# Patient Record
Sex: Female | Born: 1975 | Race: White | Hispanic: No | Marital: Married | Smoking: Never smoker
Health system: Southern US, Community
[De-identification: ages and names within clinical notes are randomized; demographics above are authoritative.]

## PROBLEM LIST (undated history)

## (undated) DIAGNOSIS — E785 Hyperlipidemia, unspecified: Secondary | ICD-10-CM

## (undated) HISTORY — PX: TUBAL LIGATION: SHX77

## (undated) HISTORY — DX: Hyperlipidemia, unspecified: E78.5

---

## 2000-08-09 ENCOUNTER — Other Ambulatory Visit: Admission: RE | Admit: 2000-08-09 | Discharge: 2000-08-09 | Payer: Self-pay | Admitting: Obstetrics and Gynecology

## 2001-12-26 ENCOUNTER — Other Ambulatory Visit: Admission: RE | Admit: 2001-12-26 | Discharge: 2001-12-26 | Payer: Self-pay | Admitting: Obstetrics and Gynecology

## 2003-04-07 ENCOUNTER — Inpatient Hospital Stay (HOSPITAL_COMMUNITY): Admission: AD | Admit: 2003-04-07 | Discharge: 2003-04-10 | Payer: Self-pay | Admitting: Obstetrics and Gynecology

## 2003-05-20 ENCOUNTER — Other Ambulatory Visit: Admission: RE | Admit: 2003-05-20 | Discharge: 2003-05-20 | Payer: Self-pay | Admitting: Obstetrics and Gynecology

## 2007-01-06 ENCOUNTER — Inpatient Hospital Stay (HOSPITAL_COMMUNITY): Admission: AD | Admit: 2007-01-06 | Discharge: 2007-01-07 | Payer: Self-pay | Admitting: Obstetrics and Gynecology

## 2007-01-08 ENCOUNTER — Inpatient Hospital Stay (HOSPITAL_COMMUNITY): Admission: AD | Admit: 2007-01-08 | Discharge: 2007-01-10 | Payer: Self-pay | Admitting: Obstetrics and Gynecology

## 2007-01-09 ENCOUNTER — Encounter (INDEPENDENT_AMBULATORY_CARE_PROVIDER_SITE_OTHER): Payer: Self-pay | Admitting: Obstetrics & Gynecology

## 2010-01-31 ENCOUNTER — Emergency Department (HOSPITAL_COMMUNITY): Admission: EM | Admit: 2010-01-31 | Discharge: 2010-01-31 | Payer: Self-pay | Admitting: Emergency Medicine

## 2010-08-31 NOTE — Op Note (Signed)
NAMECEDAR, Brandi Lloyd               ACCOUNT NO.:  [id number]   MEDICAL RECORD NO.:  [id number]          PATIENT TYPE:  INP   LOCATION:  9110                          FACILITY:  [facility]   PHYSICIAN:  Gerrit Friends. Aldona Bar, M.D.   DATE OF BIRTH:  1975/08/05   DATE OF PROCEDURE:  01/09/2007  DATE OF DISCHARGE:                               OPERATIVE REPORT   PREOPERATIVE DIAGNOSES:  1. Postpartum.  2. Desire for permanent elective sterilization.   POSTOPERATIVE DIAGNOSES:  1. Postpartum.  2. Desire for permanent elective sterilization.  3. Pathology pending.   PROCEDURE:  [place] tubal sterilization procedure for permanent elective  sterilization.   SURGEON:  Gerrit Friends. Aldona Bar, M.D.   ANESTHESIA:  Failed epidural/general endotracheal.   HISTORY:  This 35 year old patient delivered on the evening of September  22 and expressed a desire for a permanent elective sterilization  procedure.  She is aware that such a procedure is meant to be 100%  permanent but, unfortunately, is not 100% perfect as subsequent  pregnancy can result.   According to her wishes, she is now being taken to the operating room  for a postpartum tubal sterilization procedure.   Once in the operating room, the epidural was augmented but,  unfortunately, was not found to be effective.  Thereafter general  endotracheal anesthesia was carried out without difficulty.   Once the patient was appropriately anesthetized, she was prepped and  draped in the usual fashion and procedure was begun.   A 1.5-cm subumbilical midline skin incision was made and with minimal  difficulty dissected down to and through the peritoneum.  The fundus of  the uterus felt and looked very normal.  The right ovary was not  visualized or palpated, but the right fallopian tube came into easy  view, was traced out the fimbriated end for positive identification, and  then the midportion of the right fallopian tube in an area was elevated  and a single  free tie of #1 plain catgut suture tied about the knuckle  and the knuckle was excised and sent to pathology with good hemostasis  resulting.   A similar procedure was carried out on the left fallopian tube without  difficulty.   Once both fallopian tubes had been appropriately rendered such that a  segment had been removed, with hemostasis noted be adequate, the  procedure was felt to be complete and closure of the abdomen was carried  out.  The abdominal peritoneum was closed with 0 Vicryl in a running  fashion.  Fascia was closed with 0 Vicryl in interrupted fashion and  skin was closed in a subcuticular fashion with 4-0 Vicryl.  A pressure  dressing was applied and the patient was awakened and transported to the  recovery room in satisfactory condition, having tolerated it well.  Estimated blood loss negligible.  All counts correct x2.  Pathologic  specimen consisted of a segment of each fallopian tube.      Gerrit Friends. Aldona Bar, M.D.  Electronically Signed     RMW/MEDQ  D:  01/09/2007  T:  01/09/2007  Job:  [id number]

## 2010-09-03 NOTE — Discharge Summary (Signed)
NAMEALPA, SALVO                         ACCOUNT NO.:  [id number]   MEDICAL RECORD NO.:  [id number]                   PATIENT TYPE:  INP   LOCATION:  9115                                 FACILITY:  [facility]   PHYSICIAN:  Gerrit Friends. Aldona Bar, M.D.                DATE OF BIRTH:  [birth year]   DATE OF ADMISSION:  04/07/2003  DATE OF DISCHARGE:  04/10/2003                                 DISCHARGE SUMMARY   DISCHARGE DIAGNOSES:  1. Term pregnancy, delivered 6 pound 8 ounce female infant, Apgars 8 and 9.  2. Blood type O+.   PROCEDURE:  1. Normal spontaneous delivery.  2. First degree tear and repair.   SUMMARY:  This 35 year old primigravida was admitted at term in early labor  after a totally uncomplicated pregnancy.  At the time of admission she was 2  cm dilated with the vertex at -1/-2 station.  Amniotomy was carried out with  production of clear fluid and the patient was observed.  She progressed well  and had a normal spontaneous delivery of a viable female infant over a first  degree tear which was repaired.  Her postpartum course was totally benign.  Her discharge hemoglobin was 12.9 with a white count of 16,300 and a  platelet count of 146,000.  On the morning of December 23 she was ambulating  well, tolerating a regular diet well, having normal bowel and bladder  function, was afebrile.  Her breast-feeding was going well and she was  deemed ready for discharge.  Accordingly, she was given all appropriate  instructions and understood all instructions well.   DISCHARGE MEDICATIONS:  1. Vitamins one daily.  2. Motrin 600 mg q.6h. as needed for pain.  3. Tylox one to two q.4-6h. as needed for more severe pain.   FOLLOWUP:  She will return to the office for follow-up in approximately four  weeks' time.   CONDITION ON DISCHARGE:  Improved.                                               Gerrit Friends. Aldona Bar, M.D.    RMW/MEDQ  D:  04/10/2003  T:  04/10/2003  Job:  [id number]

## 2010-09-03 NOTE — Discharge Summary (Signed)
Brandi Lloyd, Brandi Lloyd               ACCOUNT NO.:  [id number]   MEDICAL RECORD NO.:  [id number]          PATIENT TYPE:  INP   LOCATION:  9110                          [facility]ACILITY:  [facility]   PHYSICIAN:  Malva Limes, M.D.    DATE OF BIRTH:  20-Dec-1975   DATE OF ADMISSION:  01/08/2007  DATE OF DISCHARGE:  01/10/2007                               DISCHARGE SUMMARY   FINAL DIAGNOSES:  1. Intrauterine pregnancy at 40 and one-seventh weeks gestation.  2. Active labor.  3. Spontaneous vaginal delivery of a female infant with Apgars of 7      and 9 over a second-degree perineal laceration.  Delivery performed      by Dr. Waynard Reeds.  4. Postpartum tubal ligation procedure performed by Dr. Annamaria Helling.   COMPLICATIONS:  None.   This 35 year old G2, P1-0-0-1 presents at [redacted] weeks gestation in active  labor.  The patient's antepartum course up to this point had been  uncomplicated.  She has expressed her desire for permanent sterilization  postpartum.  The patient had the delivery of a 6-pound 12-ounce female  infant with Apgars of 7 and 9 over a second-degree laceration which was  repaired.  The delivery went without complication.  This was performed  by Dr. Waynard Reeds.  She still expressed her desires for postpartum  tubal ligation.  She was kept n.p.o.  She had a discussion with Dr.  Annamaria Helling the next morning and was taken for her postpartum tubal  ligation which was, like I said, performed by Dr. Annamaria Helling.  The  procedure went without complications and the patient's postoperative  course was benign without any significant fevers.  She was felt ready  for discharge on postpartum day #2, which was postoperative day #1.  She  was sent home on a regular diet, told to decrease activities, told to  continue her prenatal vitamins, was given Percocet one to two every 4-6  hours as needed for pain, was to follow up in our office in 4 weeks.   LABORATORY ON DISCHARGE:  The patient had a  hemoglobin of a 12.5; white  blood cell count of 13.3; and platelets of 190,000.      Leilani Able, P.A.-C.    ______________________________  Malva Limes, M.D.    MB/MEDQ  D:  02/16/2007  T:  02/17/2007  Job:  [id number]

## 2011-01-19 ENCOUNTER — Other Ambulatory Visit: Payer: Self-pay | Admitting: Obstetrics and Gynecology

## 2011-01-27 LAB — CBC
HCT: 35.9 — ABNORMAL LOW
MCHC: 34.5
MCV: 96.6
Platelets: 190
RBC: 4.44
WBC: 11.9 — ABNORMAL HIGH
WBC: 13.3 — ABNORMAL HIGH

## 2011-01-27 LAB — COMPREHENSIVE METABOLIC PANEL
AST: 34
Albumin: 2.9 — ABNORMAL LOW
Chloride: 105
Creatinine, Ser: 0.65
GFR calc Af Amer: >60
Potassium: 3.1 — ABNORMAL LOW
Sodium: 134 — ABNORMAL LOW
Total Bilirubin: 0.9

## 2011-01-27 LAB — RPR: RPR Ser Ql: NONREACTIVE

## 2013-07-11 ENCOUNTER — Other Ambulatory Visit: Payer: Self-pay | Admitting: Obstetrics and Gynecology

## 2014-09-05 ENCOUNTER — Other Ambulatory Visit: Payer: Self-pay | Admitting: Obstetrics and Gynecology

## 2014-09-08 LAB — CYTOLOGY - PAP

## 2014-09-09 ENCOUNTER — Other Ambulatory Visit: Payer: Self-pay | Admitting: Obstetrics and Gynecology

## 2014-09-09 DIAGNOSIS — R928 Other abnormal and inconclusive findings on diagnostic imaging of breast: Secondary | ICD-10-CM

## 2014-10-24 ENCOUNTER — Ambulatory Visit
Admission: RE | Admit: 2014-10-24 | Discharge: 2014-10-24 | Disposition: A | Discharge status: Home / Self Care | Payer: 59 | Source: Ambulatory Visit | Attending: Obstetrics and Gynecology | Admitting: Obstetrics and Gynecology

## 2014-10-24 DIAGNOSIS — R928 Other abnormal and inconclusive findings on diagnostic imaging of breast: Secondary | ICD-10-CM

## 2015-11-04 ENCOUNTER — Other Ambulatory Visit: Payer: Self-pay | Admitting: Obstetrics and Gynecology

## 2015-11-05 LAB — CYTOLOGY - PAP

## 2015-11-16 ENCOUNTER — Other Ambulatory Visit: Payer: Self-pay | Admitting: Obstetrics and Gynecology

## 2015-11-16 DIAGNOSIS — R928 Other abnormal and inconclusive findings on diagnostic imaging of breast: Secondary | ICD-10-CM

## 2015-11-25 ENCOUNTER — Ambulatory Visit
Admission: RE | Admit: 2015-11-25 | Discharge: 2015-11-25 | Disposition: A | Discharge status: Home / Self Care | Payer: 59 | Source: Ambulatory Visit | Attending: Obstetrics and Gynecology | Admitting: Obstetrics and Gynecology

## 2015-11-25 ENCOUNTER — Other Ambulatory Visit: Payer: Self-pay | Admitting: Obstetrics and Gynecology

## 2015-11-25 DIAGNOSIS — N63 Unspecified lump in unspecified breast: Secondary | ICD-10-CM

## 2015-11-25 DIAGNOSIS — R928 Other abnormal and inconclusive findings on diagnostic imaging of breast: Secondary | ICD-10-CM

## 2015-11-26 ENCOUNTER — Other Ambulatory Visit: Payer: Self-pay | Admitting: Obstetrics and Gynecology

## 2015-11-26 DIAGNOSIS — N63 Unspecified lump in unspecified breast: Secondary | ICD-10-CM

## 2015-11-27 ENCOUNTER — Ambulatory Visit
Admission: RE | Admit: 2015-11-27 | Discharge: 2015-11-27 | Disposition: A | Discharge status: Home / Self Care | Payer: 59 | Source: Ambulatory Visit | Attending: Obstetrics and Gynecology | Admitting: Obstetrics and Gynecology

## 2015-11-27 DIAGNOSIS — N63 Unspecified lump in unspecified breast: Secondary | ICD-10-CM

## 2016-04-29 DIAGNOSIS — J029 Acute pharyngitis, unspecified: Secondary | ICD-10-CM | POA: Diagnosis not present

## 2016-04-29 DIAGNOSIS — R05 Cough: Secondary | ICD-10-CM | POA: Diagnosis not present

## 2016-05-09 ENCOUNTER — Other Ambulatory Visit: Payer: Self-pay | Admitting: Obstetrics and Gynecology

## 2016-05-09 DIAGNOSIS — N63 Unspecified lump in unspecified breast: Secondary | ICD-10-CM

## 2016-06-14 ENCOUNTER — Ambulatory Visit
Admission: RE | Admit: 2016-06-14 | Discharge: 2016-06-14 | Disposition: A | Discharge status: Home / Self Care | Payer: 59 | Source: Ambulatory Visit | Attending: Obstetrics and Gynecology | Admitting: Obstetrics and Gynecology

## 2016-06-14 DIAGNOSIS — N63 Unspecified lump in unspecified breast: Secondary | ICD-10-CM

## 2016-06-14 DIAGNOSIS — R922 Inconclusive mammogram: Secondary | ICD-10-CM | POA: Diagnosis not present

## 2017-03-01 IMAGING — MG 2D DIGITAL DIAGNOSTIC UNILATERAL RIGHT MAMMOGRAM WITH CAD AND AD
4 series · 4 of 12 positions shown · non-contrast
Comparison: Previous exams including recent screening mammogram
dated 11/04/2015.

CLINICAL DATA: Patient presents today for further evaluation of a
possible right breast mass identified on recent screening mammogram.

EXAM:
2D DIGITAL DIAGNOSTIC RIGHT MAMMOGRAM WITH CAD AND ADJUNCT TOMO
ULTRASOUND RIGHT BREAST

[R CC]
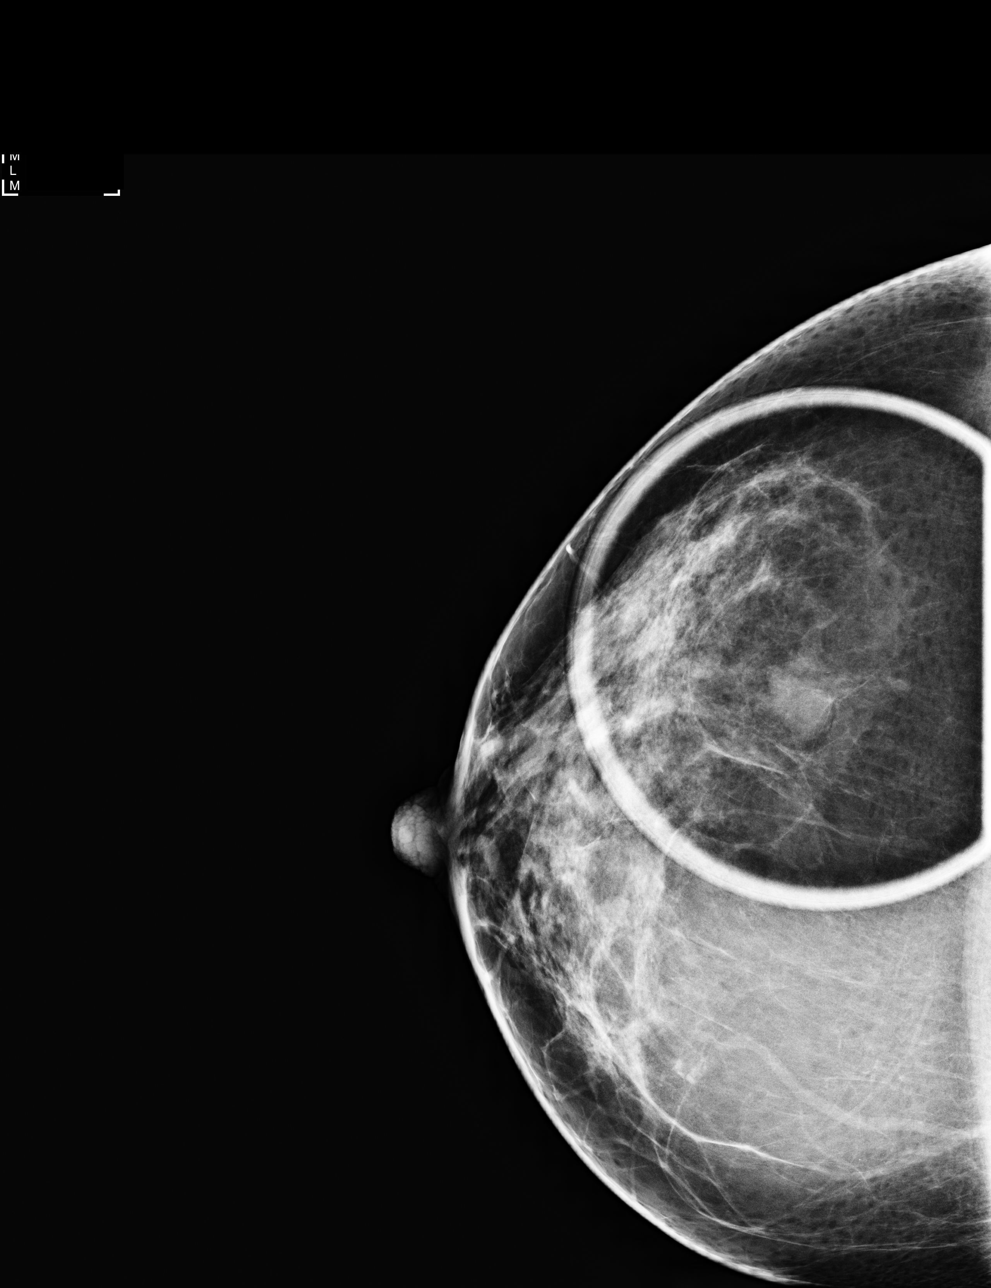

[R MLO]
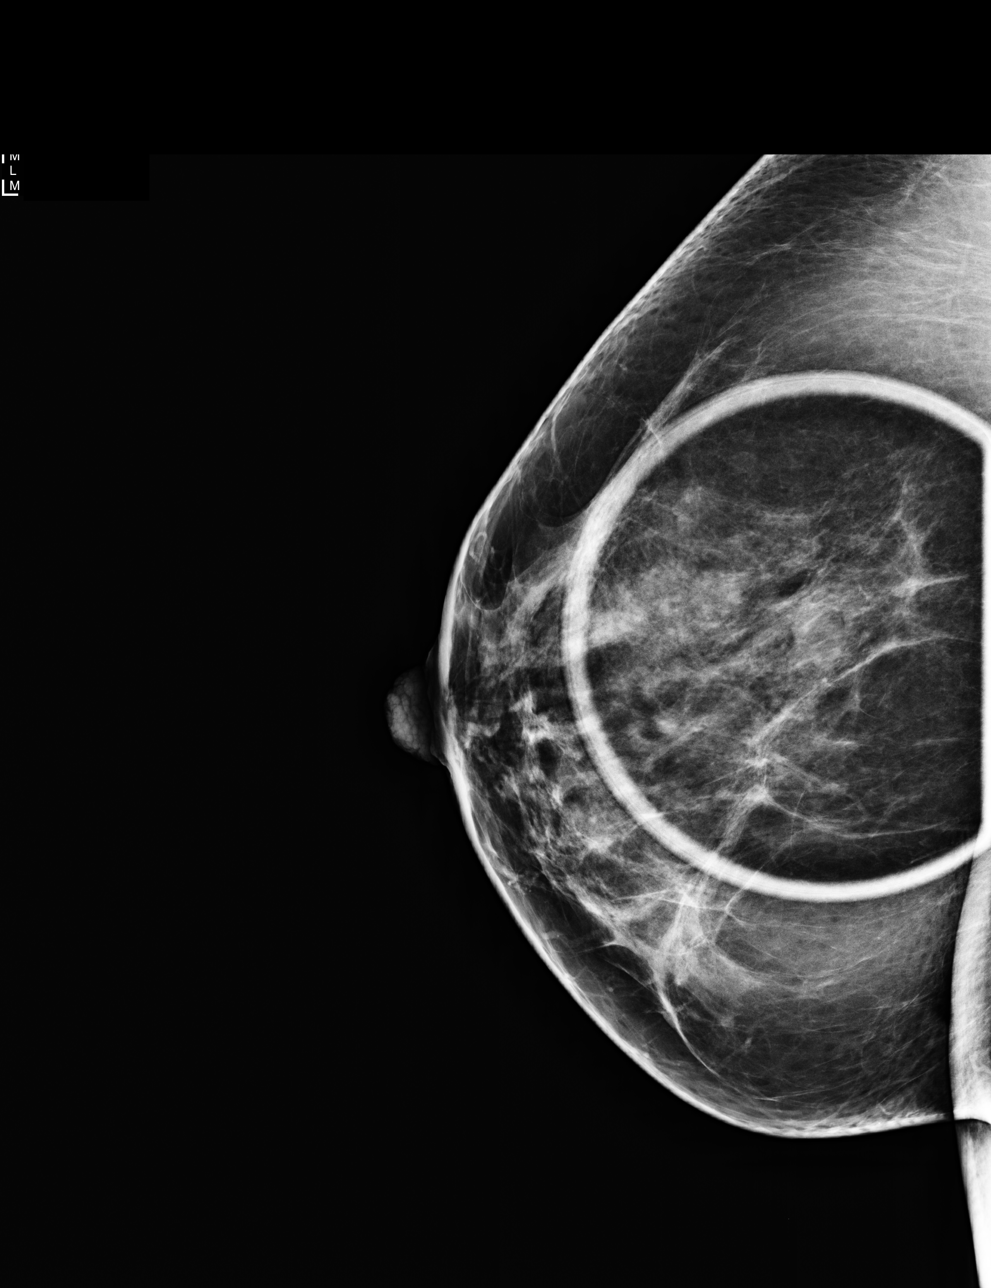

[R MLO tomo · tomo slice 38/75.0]
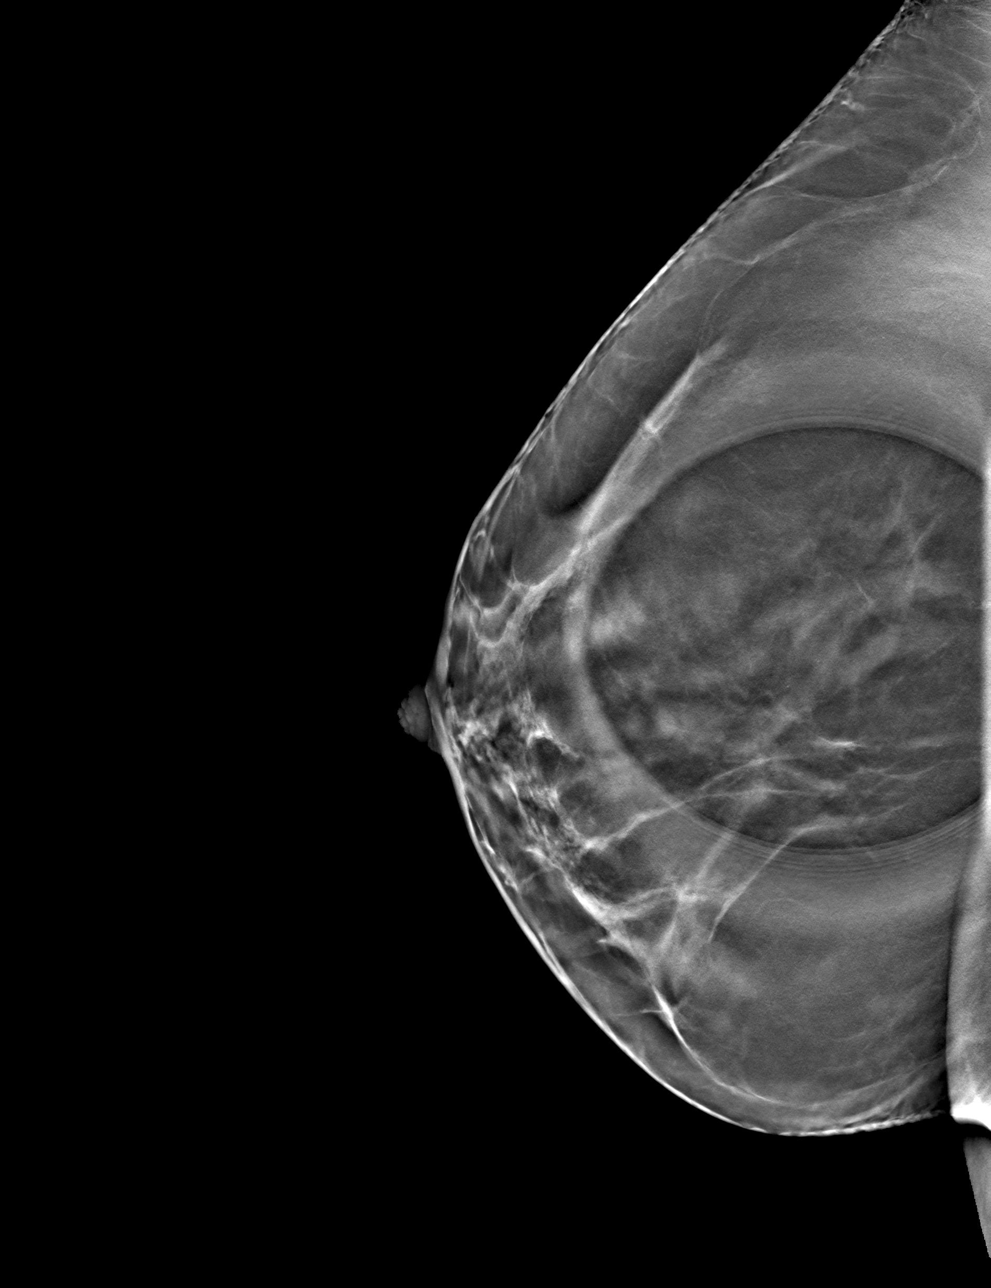

[R CC tomo · tomo slice 35/68.0]
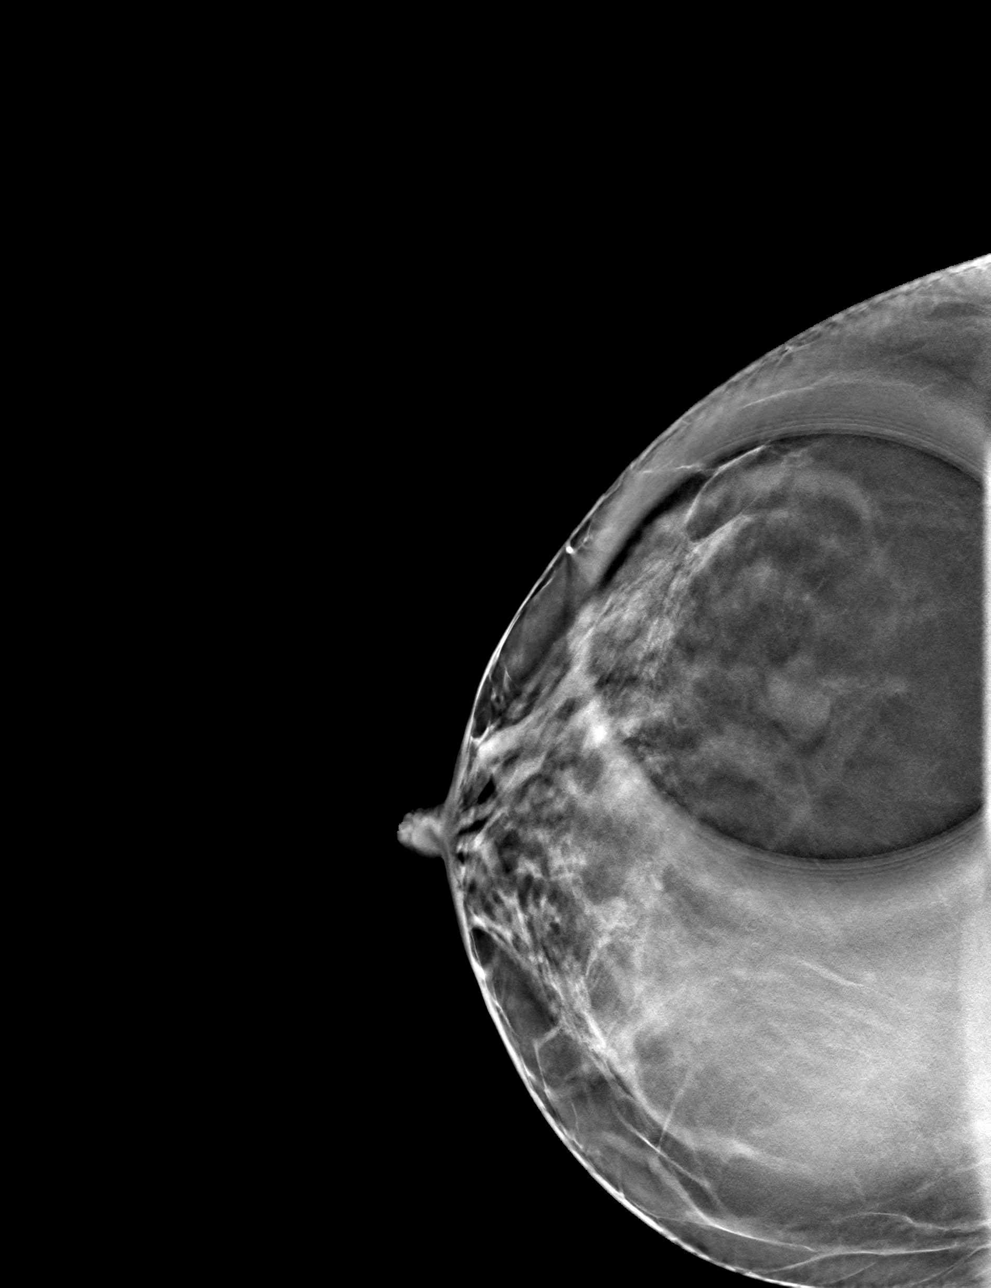

[4 of 12 positions shown; findings below may reference images not displayed]

ACR Breast Density Category c: The breast tissue is heterogeneously
dense, which may obscure small masses.
FINDINGS: On today's additional views with spot compression and 3D
tomosynthesis, there is an oval partially obscured mass confirmed in
the outer right breast, 9 o'clock axis region, at middle to
posterior depth, measuring approximately 1.3 cm greatest dimension.

Mammographic images were processed with CAD.

Targeted ultrasound is performed, showing an irregular hypoechoic
mass in the right breast at the 9 o'clock axis, 2 cm from the
nipple, measuring 1.4 x 0.7 x 1.1 cm, a likely correlate for the
mammographic finding.

Right axilla was evaluated with ultrasound showing no enlarged or
morphologically abnormal lymph nodes.
IMPRESSION: Irregular mass in the right breast at the 9 o'clock axis, 2 cm from
the nipple, measuring 1.4 x 0.7 x 1.1 cm, a likely correlate for the
mammographic finding, favored to represent fibroadenoma but a
suspicious finding for which ultrasound-guided biopsy is
recommended.

RECOMMENDATION:
Ultrasound-guided biopsy of the right breast mass at the 9 o'clock
axis.

Ultrasound-guided biopsy is scheduled for [REDACTED].

I have discussed the findings and recommendations with the patient.
Results were also provided in writing at the conclusion of the
visit. If applicable, a reminder letter will be sent to the patient
regarding the next appointment.

BI-RADS CATEGORY  4: Suspicious.

## 2017-03-08 DIAGNOSIS — E7849 Other hyperlipidemia: Secondary | ICD-10-CM | POA: Diagnosis not present

## 2017-03-08 DIAGNOSIS — Z0000 Encounter for general adult medical examination without abnormal findings: Secondary | ICD-10-CM | POA: Diagnosis not present

## 2017-03-08 DIAGNOSIS — R82998 Other abnormal findings in urine: Secondary | ICD-10-CM | POA: Diagnosis not present

## 2017-03-16 DIAGNOSIS — E7849 Other hyperlipidemia: Secondary | ICD-10-CM | POA: Diagnosis not present

## 2017-03-16 DIAGNOSIS — Z23 Encounter for immunization: Secondary | ICD-10-CM | POA: Diagnosis not present

## 2017-03-16 DIAGNOSIS — Z0000 Encounter for general adult medical examination without abnormal findings: Secondary | ICD-10-CM | POA: Diagnosis not present

## 2017-04-21 DIAGNOSIS — Z1231 Encounter for screening mammogram for malignant neoplasm of breast: Secondary | ICD-10-CM | POA: Diagnosis not present

## 2017-04-21 DIAGNOSIS — Z01419 Encounter for gynecological examination (general) (routine) without abnormal findings: Secondary | ICD-10-CM | POA: Diagnosis not present

## 2017-04-21 DIAGNOSIS — Z124 Encounter for screening for malignant neoplasm of cervix: Secondary | ICD-10-CM | POA: Diagnosis not present

## 2018-03-23 DIAGNOSIS — R82998 Other abnormal findings in urine: Secondary | ICD-10-CM | POA: Diagnosis not present

## 2018-03-23 DIAGNOSIS — Z0000 Encounter for general adult medical examination without abnormal findings: Secondary | ICD-10-CM | POA: Diagnosis not present

## 2018-03-28 DIAGNOSIS — R0789 Other chest pain: Secondary | ICD-10-CM | POA: Diagnosis not present

## 2018-03-28 DIAGNOSIS — Z0000 Encounter for general adult medical examination without abnormal findings: Secondary | ICD-10-CM | POA: Diagnosis not present

## 2018-03-28 DIAGNOSIS — J302 Other seasonal allergic rhinitis: Secondary | ICD-10-CM | POA: Diagnosis not present

## 2018-03-28 DIAGNOSIS — E7849 Other hyperlipidemia: Secondary | ICD-10-CM | POA: Diagnosis not present

## 2018-03-28 DIAGNOSIS — Z1389 Encounter for screening for other disorder: Secondary | ICD-10-CM | POA: Diagnosis not present

## 2019-09-18 ENCOUNTER — Other Ambulatory Visit: Payer: Self-pay | Admitting: Obstetrics and Gynecology

## 2019-09-18 DIAGNOSIS — R928 Other abnormal and inconclusive findings on diagnostic imaging of breast: Secondary | ICD-10-CM

## 2021-04-30 ENCOUNTER — Other Ambulatory Visit: Payer: Self-pay | Admitting: Obstetrics and Gynecology

## 2021-04-30 DIAGNOSIS — Z1231 Encounter for screening mammogram for malignant neoplasm of breast: Secondary | ICD-10-CM

## 2021-05-20 ENCOUNTER — Ambulatory Visit
Admission: RE | Admit: 2021-05-20 | Discharge: 2021-05-20 | Disposition: A | Discharge status: Home / Self Care | Payer: 59 | Source: Ambulatory Visit | Attending: Obstetrics and Gynecology | Admitting: Obstetrics and Gynecology

## 2021-05-20 DIAGNOSIS — Z1231 Encounter for screening mammogram for malignant neoplasm of breast: Secondary | ICD-10-CM

## 2021-05-21 ENCOUNTER — Other Ambulatory Visit: Payer: Self-pay | Admitting: Obstetrics and Gynecology

## 2021-05-21 DIAGNOSIS — R928 Other abnormal and inconclusive findings on diagnostic imaging of breast: Secondary | ICD-10-CM

## 2021-06-16 ENCOUNTER — Other Ambulatory Visit: Payer: Self-pay | Admitting: Obstetrics and Gynecology

## 2021-06-16 ENCOUNTER — Other Ambulatory Visit: Payer: 59

## 2021-06-16 DIAGNOSIS — R928 Other abnormal and inconclusive findings on diagnostic imaging of breast: Secondary | ICD-10-CM

## 2021-06-22 ENCOUNTER — Ambulatory Visit
Admission: RE | Admit: 2021-06-22 | Discharge: 2021-06-22 | Disposition: A | Discharge status: Home / Self Care | Payer: 59 | Source: Ambulatory Visit | Attending: Obstetrics and Gynecology | Admitting: Obstetrics and Gynecology

## 2021-06-22 ENCOUNTER — Other Ambulatory Visit: Payer: Self-pay

## 2021-06-22 DIAGNOSIS — R928 Other abnormal and inconclusive findings on diagnostic imaging of breast: Secondary | ICD-10-CM

## 2021-07-12 ENCOUNTER — Other Ambulatory Visit: Payer: 59

## 2022-02-04 ENCOUNTER — Encounter: Payer: Self-pay | Admitting: Gastroenterology

## 2022-02-09 ENCOUNTER — Ambulatory Visit (AMBULATORY_SURGERY_CENTER): Payer: Self-pay

## 2022-02-09 VITALS — Ht 66.0 in | Wt 186.0 lb

## 2022-02-09 DIAGNOSIS — Z1211 Encounter for screening for malignant neoplasm of colon: Secondary | ICD-10-CM

## 2022-02-09 MED ORDER — NA SULFATE-K SULFATE-MG SULF 17.5-3.13-1.6 GM/177ML PO SOLN: 1.0000 | ORAL | 0 refills | Status: DC

## 2022-02-09 NOTE — Progress Notes (Signed)

## 2022-03-03 ENCOUNTER — Encounter: Payer: Self-pay | Admitting: Gastroenterology

## 2022-03-06 ENCOUNTER — Encounter: Payer: Self-pay | Admitting: Certified Registered Nurse Anesthetist

## 2022-03-14 ENCOUNTER — Encounter: Payer: Self-pay | Admitting: Gastroenterology

## 2022-03-14 ENCOUNTER — Ambulatory Visit (AMBULATORY_SURGERY_CENTER): Payer: 59 | Admitting: Gastroenterology

## 2022-03-14 VITALS — BP 127/83 | HR 75 | Temp 98.2°F | Resp 15 | Ht 66.0 in | Wt 186.0 lb

## 2022-03-14 DIAGNOSIS — D125 Benign neoplasm of sigmoid colon: Secondary | ICD-10-CM

## 2022-03-14 DIAGNOSIS — K640 First degree hemorrhoids: Secondary | ICD-10-CM

## 2022-03-14 DIAGNOSIS — K5730 Diverticulosis of large intestine without perforation or abscess without bleeding: Secondary | ICD-10-CM

## 2022-03-14 DIAGNOSIS — Z1211 Encounter for screening for malignant neoplasm of colon: Secondary | ICD-10-CM | POA: Diagnosis present

## 2022-03-14 HISTORY — PX: COLONOSCOPY: SHX174

## 2022-03-14 MED ORDER — SODIUM CHLORIDE 0.9 % IV SOLN: 500.0000 mL | Freq: Once | INTRAVENOUS | Status: DC

## 2022-03-14 NOTE — Op Note (Signed)
[facility] Patient Name: Brandi Lloyd Procedure Date: 03/14/2022 1:41 PM MRN: [id number] Endoscopist: Gerrit Heck , MD, [id number] Age: 46 Referring MD:  Date of Birth: 17-Oct-1975 Gender: Female Account #: [id number] Procedure:                Colonoscopy Indications:              Screening for colorectal malignant neoplasm, This                            is the patient's first colonoscopy Medicines:                Monitored Anesthesia Care Procedure:                Pre-Anesthesia Assessment:                           - Prior to the procedure, a History and Physical                            was performed, and patient medications and                            allergies were reviewed. The patient's tolerance of                            previous anesthesia was also reviewed. The risks                            and benefits of the procedure and the sedation                            options and risks were discussed with the patient.                            All questions were answered, and informed consent                            was obtained. Prior Anticoagulants: The patient has                            taken no anticoagulant or antiplatelet agents. ASA                            Grade Assessment: II - A patient with mild systemic                            disease. After reviewing the risks and benefits,                            the patient was deemed in satisfactory condition to                            undergo the procedure.  After obtaining informed consent, the colonoscope                            was passed under direct vision. Throughout the                            procedure, the patient's blood pressure, pulse, and                            oxygen saturations were monitored continuously. The                            CF [id number] #[id number] was introduced through the anus                            and advanced to the  the cecum, identified by                            appendiceal orifice and ileocecal valve. The                            colonoscopy was performed without difficulty. The                            patient tolerated the procedure well. The quality                            of the bowel preparation was excellent. The                            ileocecal valve, appendiceal orifice, and rectum                            were photographed. Scope In: 1:56:22 PM Scope Out: 2:07:00 PM Scope Withdrawal Time: 0 hours 8 minutes 32 seconds  Total Procedure Duration: 0 hours 10 minutes 38 seconds  Findings:                 The perianal and digital rectal examinations were                            normal.                           A 3 mm polyp was found in the sigmoid colon. The                            polyp was sessile. The polyp was removed with a                            cold snare. Resection and retrieval were complete.                            Estimated blood loss was minimal.  Multiple medium-mouthed and small-mouthed                            diverticula were found in the transverse colon and                            ascending colon.                           Non-bleeding internal hemorrhoids were found during                            retroflexion. The hemorrhoids were small. Complications:            No immediate complications. Estimated Blood Loss:     Estimated blood loss was minimal. Impression:               - One 3 mm polyp in the sigmoid colon, removed with                            a cold snare. Resected and retrieved.                           - Diverticulosis in the transverse colon and in the                            ascending colon.                           - Non-bleeding internal hemorrhoids. Recommendation:           - Patient has a contact number available for                            emergencies. The signs and symptoms of  potential                            delayed complications were discussed with the                            patient. Return to normal activities tomorrow.                            Written discharge instructions were provided to the                            patient.                           - Resume previous diet.                           - Continue present medications.                           - Await pathology results.                           -  Repeat colonoscopy for surveillance based on                            pathology results.                           - Internal hemorrhoids were noted on this study and                            may be amenable to hemorrhoid band ligation. If you                            are interested in further treatment of these                            hemorrhoids with band ligation, please contact my                            clinic to set up an appointment for evaluation and                            treatment.                           - Return to GI clinic PRN. Gerrit Heck, MD 03/14/2022 2:11:39 PM

## 2022-03-14 NOTE — Progress Notes (Signed)
Called to room to assist during endoscopic procedure.  Patient ID and intended procedure confirmed with present staff. Received instructions for my participation in the procedure from the performing physician.  

## 2022-03-14 NOTE — Progress Notes (Signed)
GASTROENTEROLOGY PROCEDURE H&P NOTE   Primary Care Physician: Velna Hatchet, MD    Reason for Procedure:  Colon Cancer screening  Plan:    Colonoscopy  Patient is appropriate for endoscopic procedure(s) in the ambulatory ([facility]) setting.  The nature of the procedure, as well as the risks, benefits, and alternatives were carefully and thoroughly reviewed with the patient. Ample time for discussion and questions allowed. The patient understood, was satisfied, and agreed to proceed.     HPI: Brandi Lloyd is a 46 y.o. female who presents for colonoscopy for routine Colon Cancer screening.  No active GI symptoms.  No known family history of colon cancer or related malignancy.  Patient is otherwise without complaints or active issues today.  Past Medical History:  Diagnosis Date   Hyperlipidemia     Past Surgical History:  Procedure Laterality Date   COLONOSCOPY  03/14/2022   TUBAL LIGATION      Prior to Admission medications   Medication Sig Start Date End Date Taking? Authorizing Provider  atorvastatin (LIPITOR) 20 MG tablet Take 20 mg by mouth daily. 09/17/21  Yes [provider]  Cholecalciferol (VITAMIN D) 125 MCG (5000 UT) CAPS Take by mouth.   Yes [provider]  triamcinolone cream (KENALOG) 0.1 % APPLY A THIN LAYER TO THE AFFECTED AREA(S) BY TOPICAL ROUTE 2 TIMES PER DAY Patient not taking: Reported on 02/09/2022    [provider]    Current Outpatient Medications  Medication Sig Dispense Refill   atorvastatin (LIPITOR) 20 MG tablet Take 20 mg by mouth daily.     Cholecalciferol (VITAMIN D) 125 MCG (5000 UT) CAPS Take by mouth.     triamcinolone cream (KENALOG) 0.1 % APPLY A THIN LAYER TO THE AFFECTED AREA(S) BY TOPICAL ROUTE 2 TIMES PER DAY (Patient not taking: Reported on 02/09/2022)     Current Facility-Administered Medications  Medication Dose Route Frequency Provider Last Rate Last Admin   0.9 %  sodium chloride infusion  500  mL Intravenous Once Raekwan Spelman V, DO        Allergies as of 03/14/2022   (No Known Allergies)    Family History  Problem Relation Age of Onset   Colon cancer Neg Hx    Colon polyps Neg Hx    Esophageal cancer Neg Hx    Rectal cancer Neg Hx    Stomach cancer Neg Hx     Social History   Socioeconomic History   Marital status: Married    Spouse name: Not on file   Number of children: Not on file   Years of education: Not on file   Highest education level: Not on file  Occupational History   Not on file  Tobacco Use   Smoking status: Never   Smokeless tobacco: Never  Vaping Use   Vaping Use: Never used  Substance and Sexual Activity   Alcohol use: Yes    Comment: social   Drug use: Never   Sexual activity: Yes    Birth control/protection: Surgical  Other Topics Concern   Not on file  Social History Narrative   Not on file   Social Determinants of Health   Financial Resource Strain: Not on file  Food Insecurity: Not on file  Transportation Needs: Not on file  Physical Activity: Not on file  Stress: Not on file  Social Connections: Not on file  Intimate Partner Violence: Not on file    Physical Exam: Vital signs in last 24 hours: '@BP'$  136/88  Pulse 88   Temp 98.2 F (36.8 C) (Temporal)   Ht '5\' 6"'$  (1.676 m)   Wt 186 lb (84.4 kg)   LMP 03/06/2022 (Exact Date) Comment: Tubal ligation  SpO2 99%   BMI 30.02 kg/m  GEN: NAD EYE: Sclerae anicteric ENT: MMM CV: Non-tachycardic Pulm: CTA b/l GI: Soft, NT/ND NEURO:  Alert & Oriented x 3   Gerrit Heck, DO [facility]   03/14/2022 1:50 PM

## 2022-03-14 NOTE — Patient Instructions (Signed)
Handouts on hemorrhoids, diverticulosis, and polyps given to patient.  Await pathology results. Resume previous diet and continue present medications. Repeat colonoscopy for surveillance will be determined based off of pathology results. Return to GI office as needed.  YOU HAD AN ENDOSCOPIC PROCEDURE TODAY AT [facility]:   Refer to the procedure report that was given to you for any specific questions about what was found during the examination.  If the procedure report does not answer your questions, please call your gastroenterologist to clarify.  If you requested that your care partner not be given the details of your procedure findings, then the procedure report has been included in a sealed envelope for you to review at your convenience later.  YOU SHOULD EXPECT: Some feelings of bloating in the abdomen. Passage of more gas than usual.  Walking can help get rid of the air that was put into your GI tract during the procedure and reduce the bloating. If you had a lower endoscopy (such as a colonoscopy or flexible sigmoidoscopy) you may notice spotting of blood in your stool or on the toilet paper. If you underwent a bowel prep for your procedure, you may not have a normal bowel movement for a few days.  Please Note:  You might notice some irritation and congestion in your nose or some drainage.  This is from the oxygen used during your procedure.  There is no need for concern and it should clear up in a day or so.  SYMPTOMS TO REPORT IMMEDIATELY:  Following lower endoscopy (colonoscopy or flexible sigmoidoscopy):  Excessive amounts of blood in the stool  Significant tenderness or worsening of abdominal pains  Swelling of the abdomen that is new, acute  Fever of 100F or higher  For urgent or emergent issues, a gastroenterologist can be reached at any hour by calling [phone]. Do not use MyChart messaging for urgent concerns.    DIET:  We do recommend a small meal at  first, but then you may proceed to your regular diet.  Drink plenty of fluids but you should avoid alcoholic beverages for 24 hours.  ACTIVITY:  You should plan to take it easy for the rest of today and you should NOT DRIVE or use heavy machinery until tomorrow (because of the sedation medicines used during the test).    FOLLOW UP: Our staff will call the number listed on your records the next business day following your procedure.  We will call around 7:15- 8:00 am to check on you and address any questions or concerns that you may have regarding the information given to you following your procedure. If we do not reach you, we will leave a message.     If any biopsies were taken you will be contacted by phone or by letter within the next 1-3 weeks.  Please call us at [phone] if you have not heard about the biopsies in 3 weeks.    SIGNATURES/CONFIDENTIALITY: You and/or your care partner have signed paperwork which will be entered into your electronic medical record.  These signatures attest to the fact that that the information above on your After Visit Summary has been reviewed and is understood.  Full responsibility of the confidentiality of this discharge information lies with you and/or your care-partner.

## 2022-03-14 NOTE — Progress Notes (Signed)
Report given to PACU, vss 

## 2022-03-15 ENCOUNTER — Telehealth: Payer: Self-pay | Admitting: *Deleted

## 2022-03-15 NOTE — Telephone Encounter (Signed)
  Follow up Call-     03/14/2022   12:55 PM  Call back number  Post procedure Call Back phone  # [phone]  Permission to leave phone message Yes     Patient questions:  Do you have a fever, pain , or abdominal swelling? No. Pain Score  0 *  Have you tolerated food without any problems? Yes.    Have you been able to return to your normal activities? Yes.    Do you have any questions about your discharge instructions: Diet   No. Medications  No. Follow up visit  No.  Do you have questions or concerns about your Care? No.  Actions: * If pain score is 4 or above: No action needed, pain <4.

## 2022-03-25 ENCOUNTER — Encounter: Payer: Self-pay | Admitting: Gastroenterology

## 2022-05-23 DIAGNOSIS — R5383 Other fatigue: Secondary | ICD-10-CM | POA: Diagnosis not present

## 2022-05-23 DIAGNOSIS — E559 Vitamin D deficiency, unspecified: Secondary | ICD-10-CM | POA: Diagnosis not present

## 2022-05-23 DIAGNOSIS — R7989 Other specified abnormal findings of blood chemistry: Secondary | ICD-10-CM | POA: Diagnosis not present

## 2022-05-23 DIAGNOSIS — E785 Hyperlipidemia, unspecified: Secondary | ICD-10-CM | POA: Diagnosis not present

## 2022-06-03 DIAGNOSIS — Z23 Encounter for immunization: Secondary | ICD-10-CM | POA: Diagnosis not present

## 2022-06-03 DIAGNOSIS — E785 Hyperlipidemia, unspecified: Secondary | ICD-10-CM | POA: Diagnosis not present

## 2022-06-03 DIAGNOSIS — Z1339 Encounter for screening examination for other mental health and behavioral disorders: Secondary | ICD-10-CM | POA: Diagnosis not present

## 2022-06-03 DIAGNOSIS — L659 Nonscarring hair loss, unspecified: Secondary | ICD-10-CM | POA: Diagnosis not present

## 2022-06-03 DIAGNOSIS — R5383 Other fatigue: Secondary | ICD-10-CM | POA: Diagnosis not present

## 2022-06-03 DIAGNOSIS — E559 Vitamin D deficiency, unspecified: Secondary | ICD-10-CM | POA: Diagnosis not present

## 2022-06-03 DIAGNOSIS — Z1331 Encounter for screening for depression: Secondary | ICD-10-CM | POA: Diagnosis not present

## 2022-06-03 DIAGNOSIS — Z0000 Encounter for general adult medical examination without abnormal findings: Secondary | ICD-10-CM | POA: Diagnosis not present

## 2023-07-05 DIAGNOSIS — E559 Vitamin D deficiency, unspecified: Secondary | ICD-10-CM | POA: Diagnosis not present

## 2023-07-05 DIAGNOSIS — E785 Hyperlipidemia, unspecified: Secondary | ICD-10-CM | POA: Diagnosis not present

## 2023-07-12 DIAGNOSIS — Z1331 Encounter for screening for depression: Secondary | ICD-10-CM | POA: Diagnosis not present

## 2023-07-12 DIAGNOSIS — Z1339 Encounter for screening examination for other mental health and behavioral disorders: Secondary | ICD-10-CM | POA: Diagnosis not present

## 2023-07-12 DIAGNOSIS — Z0000 Encounter for general adult medical examination without abnormal findings: Secondary | ICD-10-CM | POA: Diagnosis not present

## 2023-09-15 DIAGNOSIS — M9903 Segmental and somatic dysfunction of lumbar region: Secondary | ICD-10-CM | POA: Diagnosis not present

## 2023-09-15 DIAGNOSIS — M9904 Segmental and somatic dysfunction of sacral region: Secondary | ICD-10-CM | POA: Diagnosis not present

## 2023-09-15 DIAGNOSIS — M7918 Myalgia, other site: Secondary | ICD-10-CM | POA: Diagnosis not present

## 2023-09-15 DIAGNOSIS — M9905 Segmental and somatic dysfunction of pelvic region: Secondary | ICD-10-CM | POA: Diagnosis not present

## 2023-09-22 DIAGNOSIS — M9905 Segmental and somatic dysfunction of pelvic region: Secondary | ICD-10-CM | POA: Diagnosis not present

## 2023-09-22 DIAGNOSIS — M9904 Segmental and somatic dysfunction of sacral region: Secondary | ICD-10-CM | POA: Diagnosis not present

## 2023-09-22 DIAGNOSIS — M9903 Segmental and somatic dysfunction of lumbar region: Secondary | ICD-10-CM | POA: Diagnosis not present

## 2023-09-22 DIAGNOSIS — M7918 Myalgia, other site: Secondary | ICD-10-CM | POA: Diagnosis not present

## 2023-09-26 DIAGNOSIS — M9904 Segmental and somatic dysfunction of sacral region: Secondary | ICD-10-CM | POA: Diagnosis not present

## 2023-09-26 DIAGNOSIS — M9903 Segmental and somatic dysfunction of lumbar region: Secondary | ICD-10-CM | POA: Diagnosis not present

## 2023-09-26 DIAGNOSIS — M9905 Segmental and somatic dysfunction of pelvic region: Secondary | ICD-10-CM | POA: Diagnosis not present

## 2023-09-26 DIAGNOSIS — M7918 Myalgia, other site: Secondary | ICD-10-CM | POA: Diagnosis not present

## 2023-10-02 DIAGNOSIS — M7918 Myalgia, other site: Secondary | ICD-10-CM | POA: Diagnosis not present

## 2023-10-02 DIAGNOSIS — M9904 Segmental and somatic dysfunction of sacral region: Secondary | ICD-10-CM | POA: Diagnosis not present

## 2023-10-02 DIAGNOSIS — M9905 Segmental and somatic dysfunction of pelvic region: Secondary | ICD-10-CM | POA: Diagnosis not present

## 2023-10-02 DIAGNOSIS — M9903 Segmental and somatic dysfunction of lumbar region: Secondary | ICD-10-CM | POA: Diagnosis not present

## 2023-10-05 DIAGNOSIS — M7918 Myalgia, other site: Secondary | ICD-10-CM | POA: Diagnosis not present

## 2023-10-05 DIAGNOSIS — M9903 Segmental and somatic dysfunction of lumbar region: Secondary | ICD-10-CM | POA: Diagnosis not present

## 2023-10-05 DIAGNOSIS — M9905 Segmental and somatic dysfunction of pelvic region: Secondary | ICD-10-CM | POA: Diagnosis not present

## 2023-10-05 DIAGNOSIS — M9904 Segmental and somatic dysfunction of sacral region: Secondary | ICD-10-CM | POA: Diagnosis not present

## 2023-10-11 DIAGNOSIS — M7918 Myalgia, other site: Secondary | ICD-10-CM | POA: Diagnosis not present

## 2023-10-11 DIAGNOSIS — M9903 Segmental and somatic dysfunction of lumbar region: Secondary | ICD-10-CM | POA: Diagnosis not present

## 2023-10-11 DIAGNOSIS — M9905 Segmental and somatic dysfunction of pelvic region: Secondary | ICD-10-CM | POA: Diagnosis not present

## 2023-10-11 DIAGNOSIS — M9904 Segmental and somatic dysfunction of sacral region: Secondary | ICD-10-CM | POA: Diagnosis not present

## 2023-12-18 DIAGNOSIS — R21 Rash and other nonspecific skin eruption: Secondary | ICD-10-CM | POA: Diagnosis not present

## 2023-12-28 ENCOUNTER — Telehealth: Payer: Self-pay | Admitting: Gastroenterology

## 2023-12-28 NOTE — Telephone Encounter (Signed)
 Inbound call from Rep of [facility] requesting for us  to fax over this patient Colonoscopy report that was done on 03/14/2022. A good fax number for them is [phone]. Please advise.

## 2023-12-28 NOTE — Telephone Encounter (Signed)
 Copy of report & path letter faxed to PCP number provided.

## 2024-02-01 DIAGNOSIS — N926 Irregular menstruation, unspecified: Secondary | ICD-10-CM | POA: Diagnosis not present

## 2024-02-01 DIAGNOSIS — Z01419 Encounter for gynecological examination (general) (routine) without abnormal findings: Secondary | ICD-10-CM | POA: Diagnosis not present

## 2024-02-01 DIAGNOSIS — Z1231 Encounter for screening mammogram for malignant neoplasm of breast: Secondary | ICD-10-CM | POA: Diagnosis not present

## 2024-02-07 ENCOUNTER — Other Ambulatory Visit: Payer: Self-pay | Admitting: Obstetrics and Gynecology

## 2024-02-07 DIAGNOSIS — R928 Other abnormal and inconclusive findings on diagnostic imaging of breast: Secondary | ICD-10-CM

## 2024-02-16 ENCOUNTER — Ambulatory Visit
Admission: RE | Admit: 2024-02-16 | Discharge: 2024-02-16 | Disposition: A | Discharge status: Home / Self Care | Source: Ambulatory Visit | Attending: Obstetrics and Gynecology | Admitting: Obstetrics and Gynecology

## 2024-02-16 DIAGNOSIS — R921 Mammographic calcification found on diagnostic imaging of breast: Secondary | ICD-10-CM | POA: Diagnosis not present

## 2024-02-16 DIAGNOSIS — R928 Other abnormal and inconclusive findings on diagnostic imaging of breast: Secondary | ICD-10-CM

## 2024-02-28 DIAGNOSIS — Z30430 Encounter for insertion of intrauterine contraceptive device: Secondary | ICD-10-CM | POA: Diagnosis not present
